# Patient Record
Sex: Male | Born: 1955 | Race: White | Hispanic: No | Marital: Married
Health system: Midwestern US, Academic
[De-identification: ages and names within clinical notes are randomized; demographics above are authoritative.]

---

## 2016-11-15 ENCOUNTER — Encounter: Admit: 2016-11-15 | Discharge: 2016-11-15 | Payer: Commercial Managed Care - PPO

## 2016-11-15 DIAGNOSIS — C61 Malignant neoplasm of prostate: ICD-10-CM

## 2016-11-15 DIAGNOSIS — C9590 Leukemia, unspecified not having achieved remission: ICD-10-CM

## 2016-11-15 DIAGNOSIS — N401 Enlarged prostate with lower urinary tract symptoms: Principal | ICD-10-CM

## 2016-11-16 ENCOUNTER — Ambulatory Visit: Admit: 2016-11-16 | Discharge: 2016-11-16

## 2016-11-16 ENCOUNTER — Encounter: Admit: 2016-11-16 | Discharge: 2016-11-16 | Payer: Commercial Managed Care - PPO

## 2016-11-16 DIAGNOSIS — C61 Malignant neoplasm of prostate: Principal | ICD-10-CM

## 2016-11-16 DIAGNOSIS — R972 Elevated prostate specific antigen [PSA]: ICD-10-CM

## 2016-11-16 DIAGNOSIS — N401 Enlarged prostate with lower urinary tract symptoms: Principal | ICD-10-CM

## 2016-11-16 DIAGNOSIS — N529 Male erectile dysfunction, unspecified: ICD-10-CM

## 2016-11-16 DIAGNOSIS — C9590 Leukemia, unspecified not having achieved remission: ICD-10-CM

## 2016-11-16 NOTE — Progress Notes
Date of Service: 11/16/2016    Subjective:             Paul Stephenson is a 61 y.o. male.    History of Present Illness  Paul Stephenson is a 61 y.o. 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 Miki Kins.    - +LUTS (nocturia x1 and overall pleased) on flomax for years and finasteride since 03.2018  - +hx UTI and epididymitis prompting PSA  - +ED, sexually active but does not use PDE5(-)  - normal DRE  - cysto (05.07.2018) lateral lobe hypertrophy  - 4K (03.09.2018) 23%  - TRUS (03.29.2018) standard 12 core biopsy, L medial apex 1 core Gl 3+3=6 0.73mm; prolaris pending?  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   HENT: Negative for congestion.    Eyes: Negative for visual disturbance.   Respiratory: Negative for chest tightness and shortness of breath.    Cardiovascular: Negative for chest pain, palpitations and leg swelling.   Gastrointestinal: Negative for abdominal pain, constipation and diarrhea.   Endocrine: Negative for polyuria.   Genitourinary: Negative for decreased urine volume, difficulty urinating, discharge, dysuria, flank pain, frequency, genital sores, hematuria, penile pain, penile swelling, scrotal swelling, testicular pain and urgency.   Musculoskeletal: Negative for back pain.   Skin: Negative for rash.   Allergic/Immunologic: Positive for immunocompromised state (CLL).   Neurological: Negative for syncope.   Hematological: Negative for adenopathy. Does not bruise/bleed easily.   Psychiatric/Behavioral: Negative for behavioral problems.         Objective:         ??? finasteride (PROSCAR) 5 mg tablet Take 5 mg by mouth daily.   ??? NAPROXEN PO Take  by mouth. ??? oxymetazoline (AFRIN) 0.05 % nasal spray Apply 2 sprays to each nostril as directed twice daily.   ??? tamsulosin (FLOMAX) 0.4 mg capsule Take 0.4 mg by mouth daily. Do not crush, chew or open capsules. Take 30 minutes following the same meal each day.     Vitals:    11/16/16 1016   Weight: 101.6 kg (224 lb)   Height: 185.4 cm (73)     Body mass index is 29.55 kg/m???.     Physical Exam   Constitutional: He appears well-developed and well-nourished. No distress.   HENT:   Head: Normocephalic and atraumatic.   Mouth/Throat: No oropharyngeal exudate.   Eyes: Pupils are equal, round, and reactive to light. No scleral icterus.   Neck: Normal range of motion.   Cardiovascular: Normal rate and regular rhythm.    Pulmonary/Chest: Effort normal. No respiratory distress.   Abdominal: Soft. Bowel sounds are normal. He exhibits no distension and no mass. There is no tenderness. There is no rebound and no guarding. No hernia.   Right groin incision healed   Genitourinary: Rectum normal, prostate normal and penis normal.   Genitourinary Comments: 20g, no nodules, symmetric  Small right epididymal cyst, nontender; otherwise normal scrotal contents  Circumcised, orthotopic meatus without discharge   Skin: He is not diaphoretic.            Assessment and Plan:  Paul Stephenson is a 61 y.o.  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 Discussed all treatment options and risks/benefits associated with each including active surveillance, ablation, radiation therapy, and radical prostatectomy.    Discussed our recommendation for mpMRI of prostate and confirmatory fusion biopsy. If findings are consistent with initial biopsy, would initiate active surveillance. Patient would like to discuss with his wife and will call with his ultimate decision. Reading materials were given. We did also offer referral to radiation oncology.     Farrel Conners, MD  Clinical Urologic Oncology Fellow                    Paul Stephenson is a 54 y.o. gentleman with prostate cancer.  He presents to clinic today for further counseling and treatment options. I went over AS, XRT, cryotherapy, HIFU and RALP.  All of the details were covered with each.       I spent over , extensively counseling him face-to-face regarding the risks & benefits of robotic prostatectomy and the other options.  I explained that the potential benefits of robotic surgery include less blood loss & shorter hospital stay.  Positive margins, incontinence, & erectile dysfunction rates are similar compared to radical retropubic prostatectomy.  Possible risks and complications of robotic prostatectomy include, but are not limited to bleeding, infection, allergic reaction, heart & blood pressure problems, incontinence, erectile dysfunction, bladder neck contracture, urine leak, lymphocele, anastomotic breakdown, need for percutaneous nephrostomy tube and/ or abdominal drain placement, wound complications, nerve injury due to positioning, injury to nearby contiguous organs or structures, hernias, robot malfunction, conversion to open surgery or inability to complete the surgery.  Additional surgical risks include, but not limited to DVT, heart attack, stroke, pulmonary embolism, respiratory arrest, & death.  He is fully aware of the potential risks & complications.    He would like to proceed with a discussion with his wife and decide if he would like to do an MRI to start with..    I recommended that if  he proceeds with robotic laparoscopic assisted prostatectomy with non nerve sparing.  He does not need a bilateral pelvic lymph node dissection.     All of his questions were answered to his satisfaction.      Eda Paschal, MD

## 2016-11-19 ENCOUNTER — Encounter: Admit: 2016-11-19 | Discharge: 2016-11-19 | Payer: Commercial Managed Care - PPO

## 2016-11-19 DIAGNOSIS — C61 Malignant neoplasm of prostate: Principal | ICD-10-CM

## 2016-11-19 NOTE — Progress Notes
Orders Placed This Encounter    MRI PELVIS WO/W CONTRAST       Ihor Gully, PA-C  Urology    ===========================================================================================    MRI   Received: 11/19/2016  Message Contents   Beaulah Corin, PA-C            After discussing with his wife, Mr. Hermiz would like to proceed with scheduling an MRI. Can you please place an order?     Thanks,     [place]

## 2016-12-10 ENCOUNTER — Ambulatory Visit: Admit: 2016-12-10 | Discharge: 2016-12-10

## 2016-12-10 DIAGNOSIS — C61 Malignant neoplasm of prostate: Principal | ICD-10-CM

## 2016-12-10 LAB — POC CREATININE, RAD: Lab: 0.6 mg/dL (ref 0.4–1.24)

## 2016-12-10 MED ORDER — GADOBENATE DIMEGLUMINE 529 MG/ML (0.1MMOL/0.2ML) IV SOLN
20 mL | Freq: Once | INTRAVENOUS | 0 refills | Status: CP
Start: 2016-12-10 — End: ?
  Administered 2016-12-10: 20:00:00 20 mL via INTRAVENOUS

## 2016-12-10 MED ORDER — SODIUM CHLORIDE 0.9 % IJ SOLN
50 mL | Freq: Once | INTRAVENOUS | 0 refills | Status: CP
Start: 2016-12-10 — End: ?
  Administered 2016-12-10: 20:00:00 50 mL via INTRAVENOUS

## 2016-12-13 ENCOUNTER — Encounter: Admit: 2016-12-13 | Discharge: 2016-12-13 | Payer: Commercial Managed Care - PPO

## 2016-12-13 DIAGNOSIS — N401 Enlarged prostate with lower urinary tract symptoms: ICD-10-CM

## 2016-12-13 DIAGNOSIS — C61 Malignant neoplasm of prostate: Principal | ICD-10-CM

## 2016-12-13 NOTE — Telephone Encounter
Called pt w/ MRI Prostate results.  I read the results verbatim & then explained them in non-medical terms.      MRI Prostate (12/10/2016): No focal peripheral zone lesion to suggest high-grade prostate cancer.  Prostate volume = 85 mL.    RTC as scheduled for TRUS/ Prostate bx on 02/01/2017, as scheduled, for prostate cancer active surveillance protocol.  Pt demonstrated an understanding & agrees w/ plan.      Ihor Gully, PA-C  Urology

## 2017-01-26 ENCOUNTER — Encounter: Admit: 2017-01-26 | Discharge: 2017-01-26 | Payer: Commercial Managed Care - PPO

## 2017-01-26 MED ORDER — CIPROFLOXACIN HCL 500 MG PO TAB
500 mg | ORAL_TABLET | Freq: Two times a day (BID) | ORAL | 0 refills | 10.00000 days | Status: AC
Start: 2017-01-26 — End: ?

## 2017-02-01 ENCOUNTER — Encounter: Admit: 2017-02-01 | Discharge: 2017-02-01

## 2017-02-01 ENCOUNTER — Ambulatory Visit: Admit: 2017-02-01 | Discharge: 2017-02-01

## 2017-02-01 ENCOUNTER — Encounter: Admit: 2017-02-01 | Discharge: 2017-02-01 | Payer: Commercial Managed Care - PPO

## 2017-02-01 DIAGNOSIS — N401 Enlarged prostate with lower urinary tract symptoms: Principal | ICD-10-CM

## 2017-02-01 DIAGNOSIS — C61 Malignant neoplasm of prostate: Principal | ICD-10-CM

## 2017-02-01 DIAGNOSIS — R972 Elevated prostate specific antigen [PSA]: ICD-10-CM

## 2017-02-01 DIAGNOSIS — N529 Male erectile dysfunction, unspecified: ICD-10-CM

## 2017-02-01 DIAGNOSIS — C9590 Leukemia, unspecified not having achieved remission: ICD-10-CM

## 2017-02-01 MED ORDER — GENTAMICIN 40 MG/ML IJ SOLN
80 mg | Freq: Once | INTRAMUSCULAR | 0 refills | Status: CP
Start: 2017-02-01 — End: ?

## 2017-02-01 NOTE — Procedures
Procedure:  Transrectal ultrasound, ultrasound interpretation, periprostatic block, and multiple prostate biopsies.    Provider:  Cleatis Polka, MD    Preoperative Diagnosis:  CaP on AS with nothing on MRI    Postoperative Diagnosis:  Same    Anesthesia:  1% Lidocaine Block, 8 mL total    Complications:  None.    Indications for Procedure:  61 y.o. male with CaP on AS.  He discontinued all blood thinners & anticoagulants.  He signed informed consent prior to procedure.    Description of Operative Procedure:  After this man was placed in the left knee-chest position, prepped and draped in usual sterile fashion, a 7.5 MHz transrectal ultrasound probe was advanced into the rectum.  The prostate was examined from its apex to its base and from the lateral lobe to lateral lobe.  No hypoechoic lesions.  Periurethral calcifications.  1% lidocaine block was given at the start of the procedure at the junction of the seminal vesicle and base of the prostate.  A total of 8 cc were used.  He had excellent anesthesia from this block.  18-gauge Biopty gun was then used to biopsy the prostate starting in the paramedian plane at the base, mid and apex bilaterally and then in the anterior horn plane at the base, mid and apex bilaterally.  He tolerated the procedure well.      At the end of the procedure, we went over potential complications and side effects.  Post-procedure restrictions reviewed.  I counseled him that it is common to see blood in urine & bowel movements x 3 days & blood in the ejaculate fluid x 6 wks.  I counseled him that if he develops increased bleeding or other concerns to contact my office immediately.  He will take one more day of antibiotics and call us in 7-10 days for biopsy results.  He was discharged in excellent condition.      Cleatis Polka, MD

## 2017-02-03 ENCOUNTER — Encounter: Admit: 2017-02-03 | Discharge: 2017-02-03 | Payer: Commercial Managed Care - PPO

## 2017-02-03 NOTE — Telephone Encounter
02/03/2017 at 03:58 pm,     Pt's wife Paul Stephenson L/m on V/m, stating her husband had a Prstate Bx done on Tuesday and he is now having blood in his urine.     02/03/2017 at 04:30 pm,     (C) Called pt's wife Paul Stephenson back, she stated the above again. Asked her if he is urinating excessive blood or is it a light tent pinkish color? She stated he is only urinating a light pinkish color. Asked her if the pt is having pain while urinating or having problems urinating at all? She stated pt can urinate without a problem with no pain. Asked her if the pt is having regular BM's? She stated he is indeed. Asked her if pt has any fevers, chills, nausea, or vomiting. She stated denies any of those sx's. Asked her when the pt started having the light blood in the urine? She stated today. Explained he should be okay since he does have a light pink tent in the urine. Asked her to please contact ASAP if the sx's were to worsen. Pt's wife voiced understanding.

## 2017-02-03 NOTE — Telephone Encounter
Spoke with patient about the results of his biopsy and informed him that the results came back benign. He is scheduled to follow up in April with Clair Gulling. A copy of his results have been mailed to him. He said that he would call back if he has any other questions.

## 2017-02-07 ENCOUNTER — Encounter: Admit: 2017-02-07 | Discharge: 2017-02-07 | Payer: Commercial Managed Care - PPO

## 2017-02-07 DIAGNOSIS — C61 Malignant neoplasm of prostate: Principal | ICD-10-CM

## 2017-07-29 LAB — PROSTATIC SPECIFIC ANTIGEN-PSA: Lab: 4.8 ng/mL — ABNORMAL HIGH (ref 0.0–4.0)

## 2017-08-05 ENCOUNTER — Ambulatory Visit: Admit: 2017-08-05 | Discharge: 2017-08-06 | Payer: Commercial Managed Care - PPO

## 2017-08-05 ENCOUNTER — Encounter: Admit: 2017-08-05 | Discharge: 2017-08-05 | Payer: Commercial Managed Care - PPO

## 2017-08-05 DIAGNOSIS — N401 Enlarged prostate with lower urinary tract symptoms: Principal | ICD-10-CM

## 2017-08-05 DIAGNOSIS — C9590 Leukemia, unspecified not having achieved remission: ICD-10-CM

## 2017-08-05 DIAGNOSIS — C61 Malignant neoplasm of prostate: Principal | ICD-10-CM

## 2017-08-05 DIAGNOSIS — N529 Male erectile dysfunction, unspecified: ICD-10-CM

## 2017-08-05 DIAGNOSIS — R972 Elevated prostate specific antigen [PSA]: ICD-10-CM

## 2017-08-13 ENCOUNTER — Encounter: Admit: 2017-08-13 | Discharge: 2017-08-13 | Payer: Commercial Managed Care - PPO

## 2017-08-13 DIAGNOSIS — N401 Enlarged prostate with lower urinary tract symptoms: Principal | ICD-10-CM

## 2017-08-13 DIAGNOSIS — C9590 Leukemia, unspecified not having achieved remission: ICD-10-CM

## 2017-08-13 DIAGNOSIS — C61 Malignant neoplasm of prostate: ICD-10-CM

## 2017-08-13 DIAGNOSIS — R972 Elevated prostate specific antigen [PSA]: ICD-10-CM

## 2017-08-13 DIAGNOSIS — N529 Male erectile dysfunction, unspecified: Secondary | ICD-10-CM

## 2023-02-01 IMAGING — CR [ID]
6 series · 6 of 6 positions shown · non-contrast
Comparison: none

[w cervical spine lat]
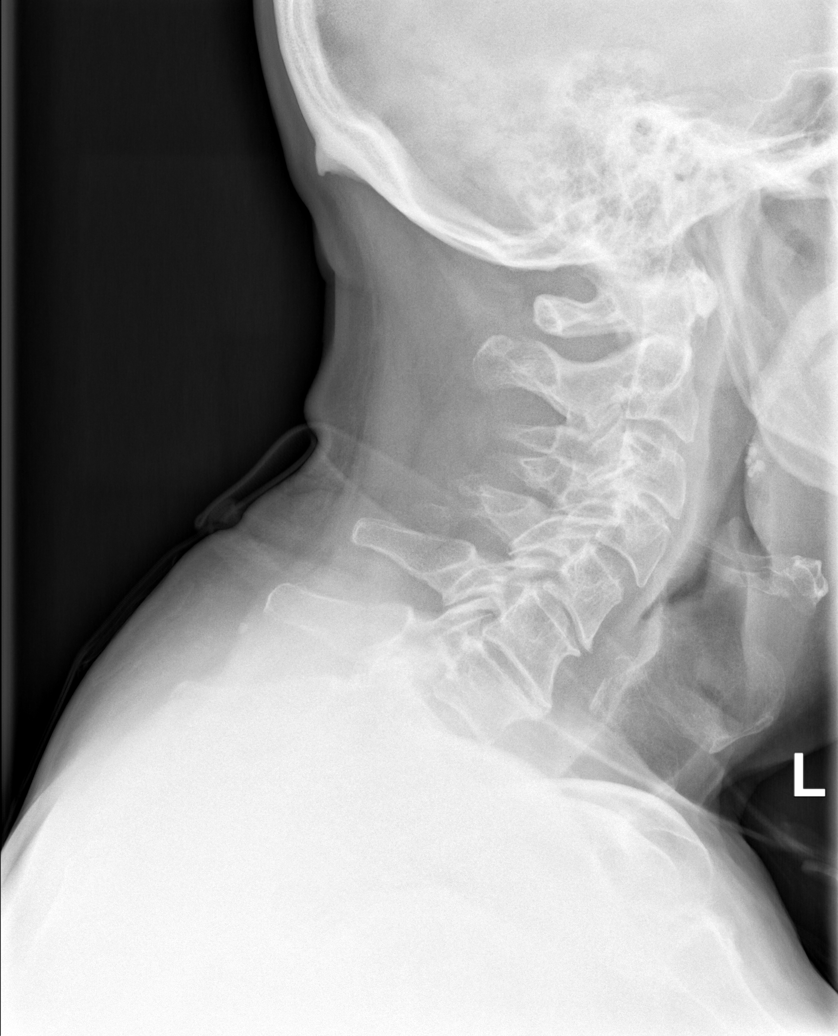

[w cervical spine ap_obl (1 of 2)]
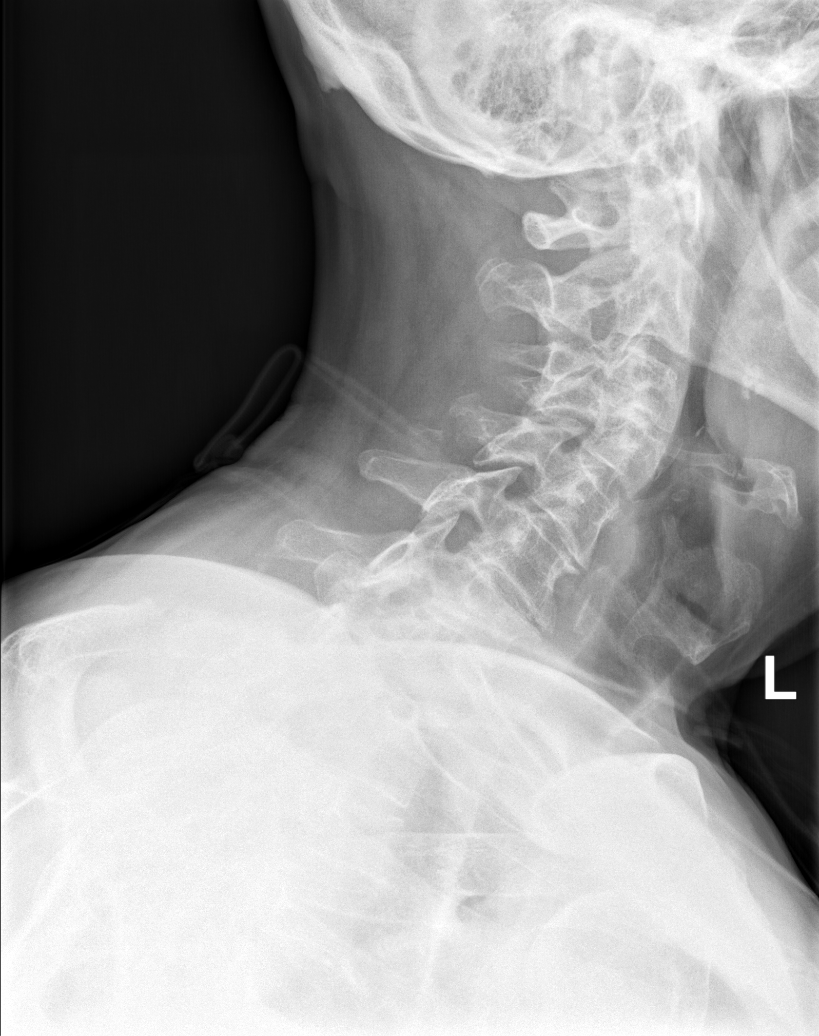

[w cervical spine ap_obl (2 of 2)]
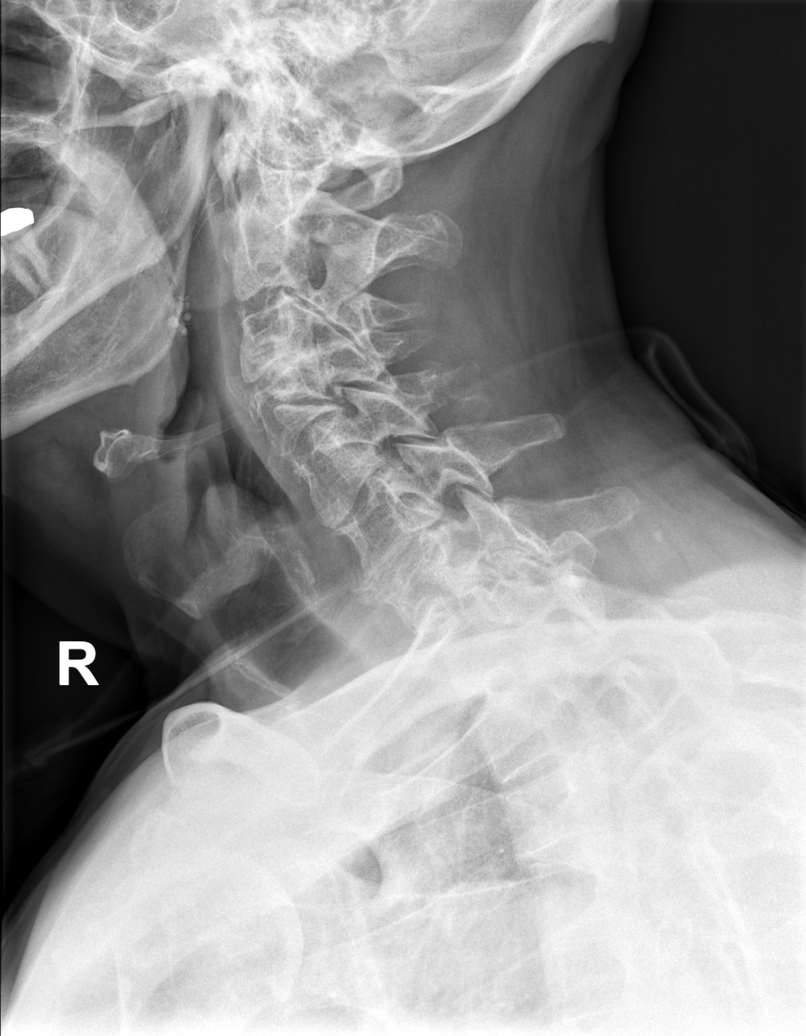

[w cervical spine ap]
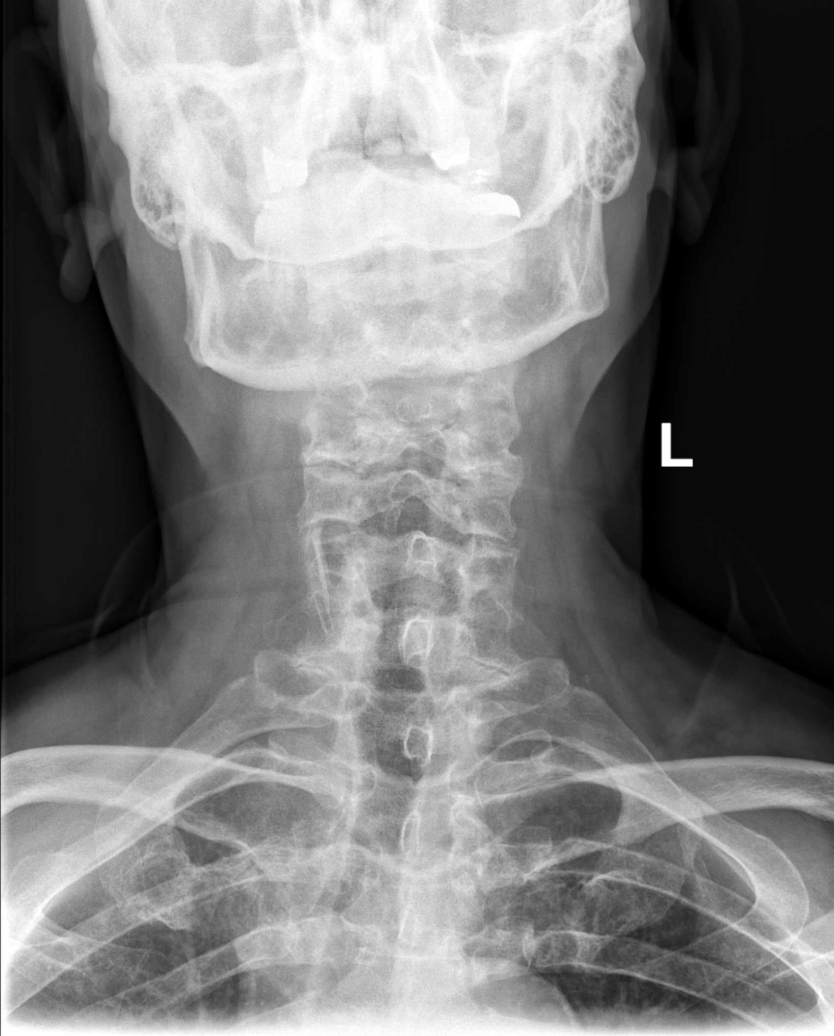

[w cervical spine odontoid (1 of 2)]
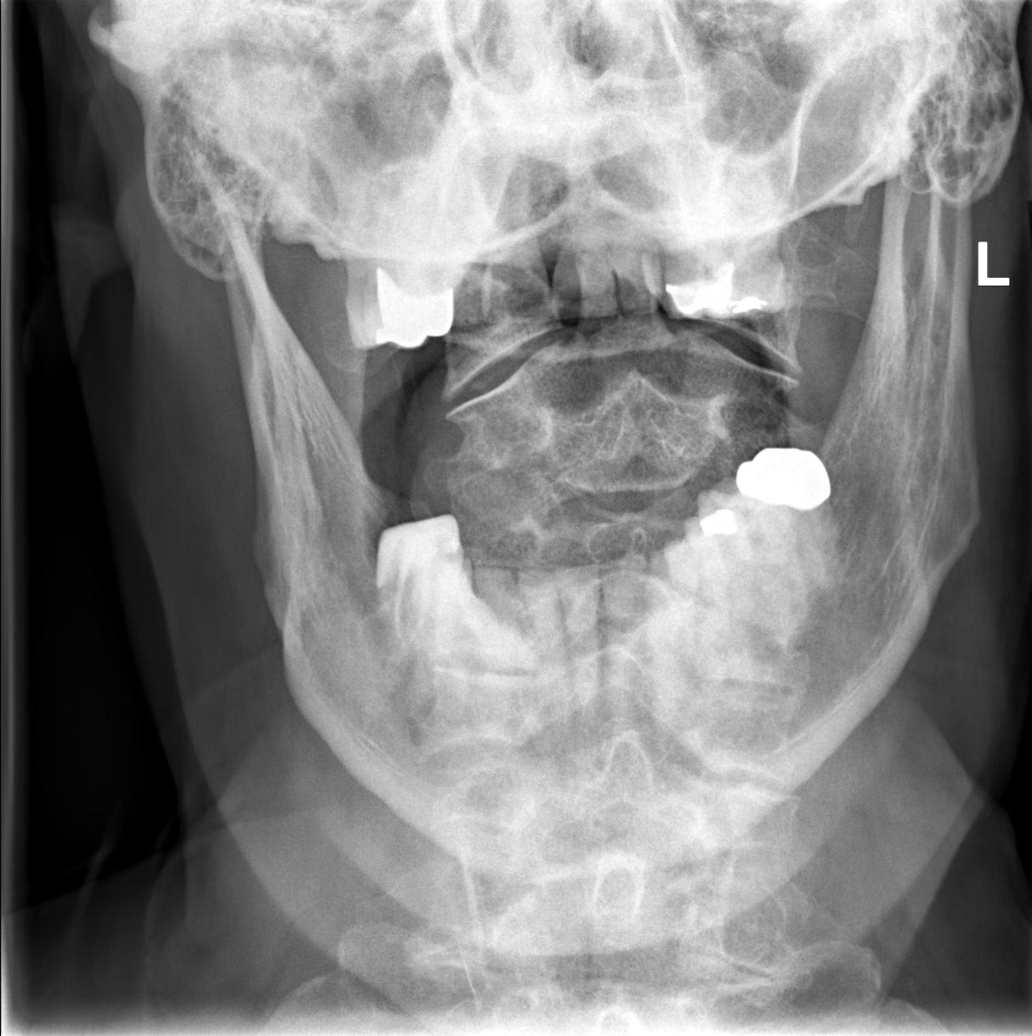

[w cervical spine odontoid (2 of 2)]
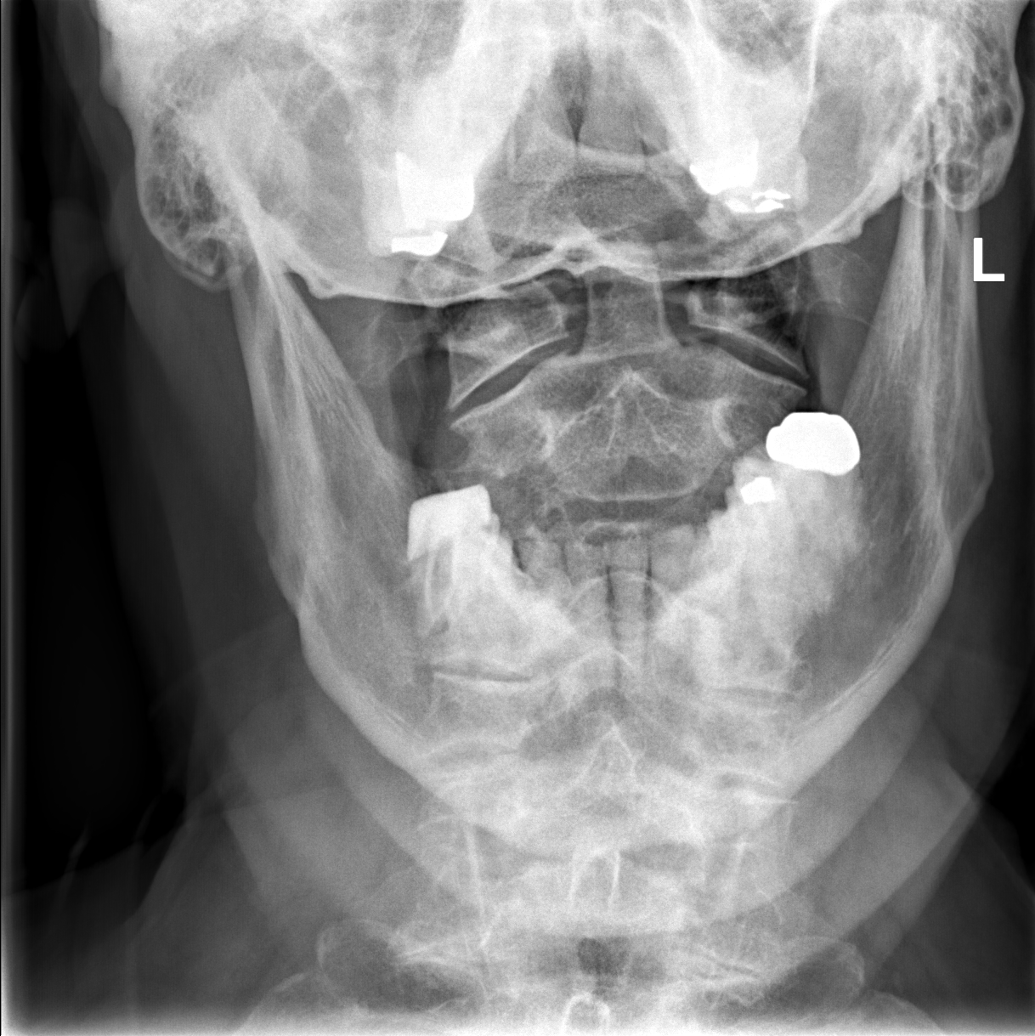

[6 of 6 positions shown; findings below may reference images not displayed]

DIAGNOSTIC STUDIES

EXAM

XR cervical spine 5V

INDICATION

Pt fell on neck 6 days ago, landing head first
neck pain that goes down left arm after a fall last [REDACTED] where pt fell and hit head on garage
door, denies surgery or prior trauma

TECHNIQUE

AP lateral both oblique and odontoid views

COMPARISONS

None available

FINDINGS

Apparent fracture through the spinous process of seen 5 is noted. There is disc space narrowing
osteophytic spurring at C5-6 and C6-7. Prevertebral soft tissues are within normal limits. There is
normal alignment the cervical vertebral bodies. CT cervical spine is recommended.

IMPRESSION

Apparent fracture through the spinous process of C5. Some of the configuration likely is
developmental however does appear to be displaced and now when compared to prior CT soft tissues of
the neck. CT scan of the neck is recommended. Report called to Daija Monge at [DATE] a.m..

Tech Notes:

neck pain that goes down left arm after a fall last [REDACTED] where pt fell and hit head on garage
door, denies surgery or prior trauma

## 2023-02-01 IMAGING — CT C_SPINE_(Adult)
4 of 5 series · 14 of 33 positions shown, 16 images · non-contrast
Comparison: none

[Series 2: c-spine ax 2.00 br60 s3 · axial · 0.31mm/px · z∈[-635,-568]mm · 2 of 105 slices shown]
[im 35/105  bone]
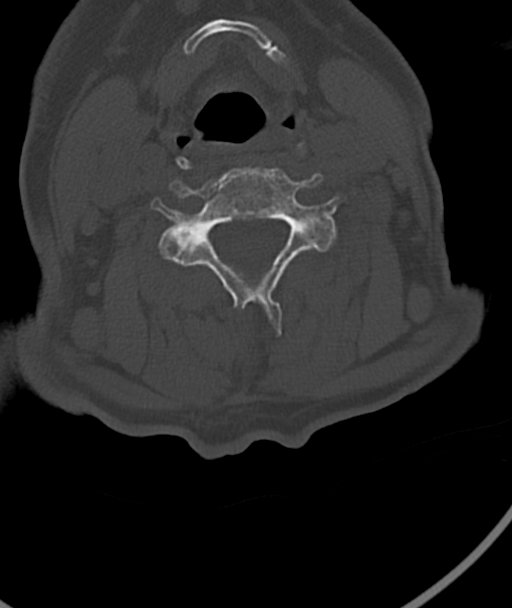
[im 70/105  bone]
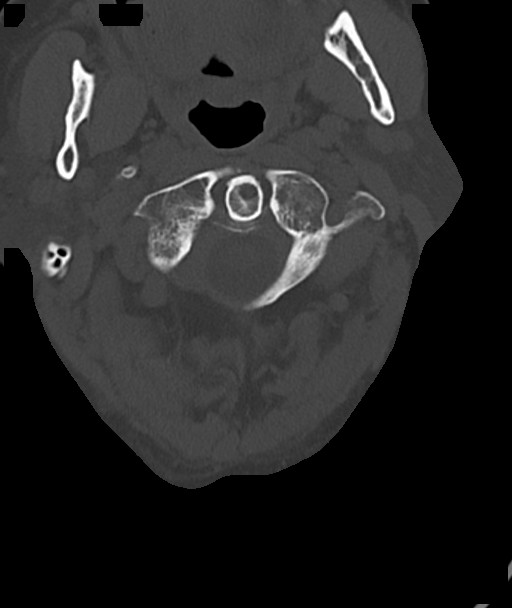

[Series 4: c-spine cor 2.00 br60 s3 · coronal · 0.31mm/px · 3 of 93 slices shown]
[im 19/93  bone]
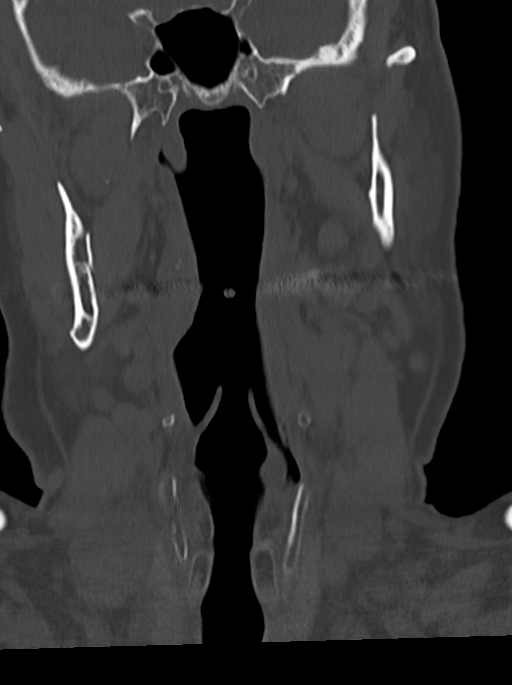
[im 37/93  bone]
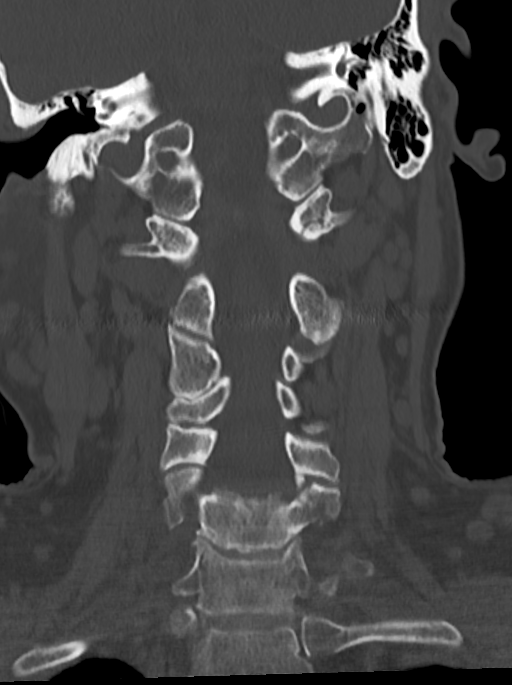
[im 56/93  bone]
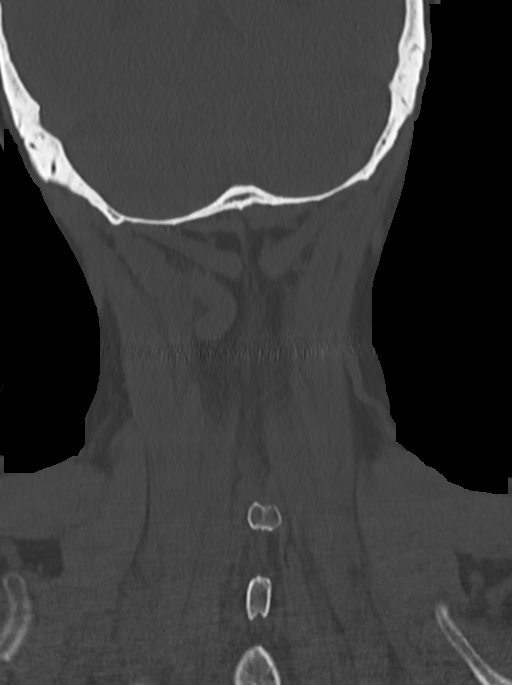

[Series 6: c-spine sag 2.00 br60 s3 · sagittal · 0.37mm/px · 5 of 78 slices shown, 6 images]
[im 26/78  bone]
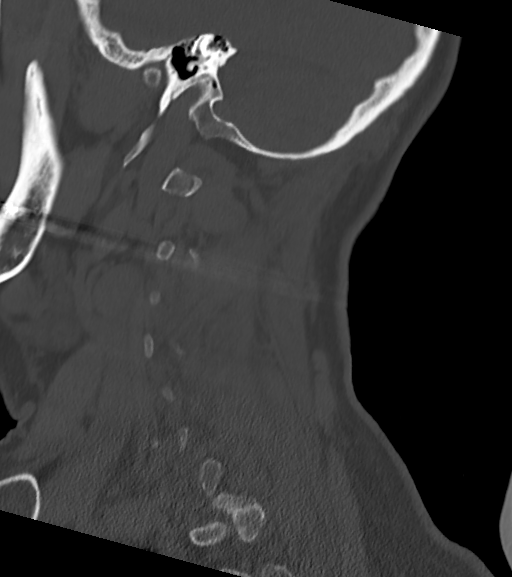
[im 33/78  bone]
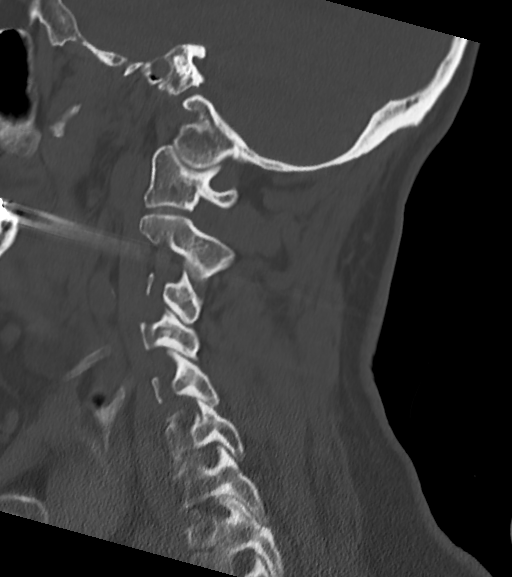
[im 39/78  soft-tissue]
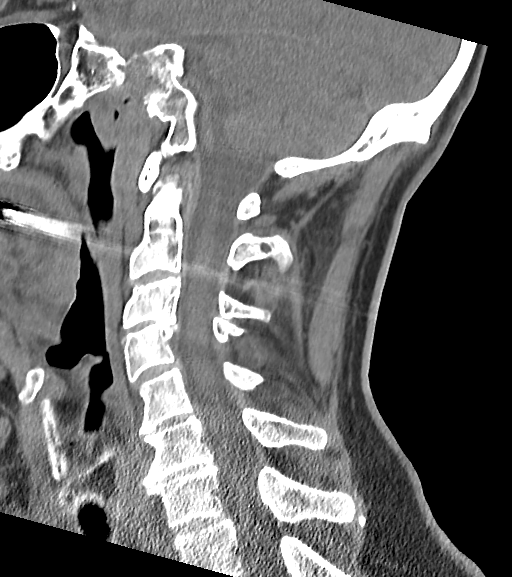
[im 39/78  bone]
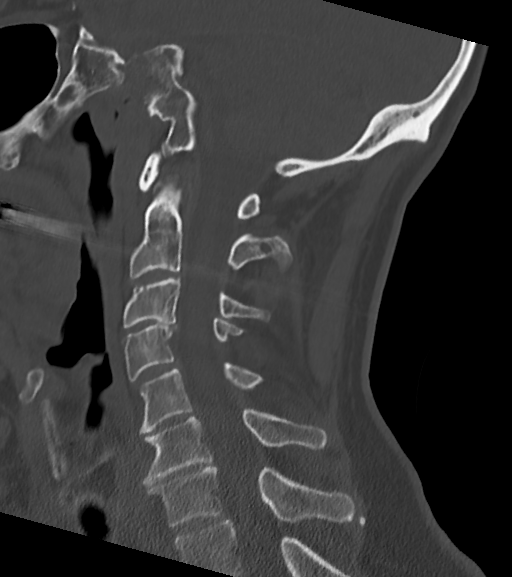
[im 45/78  bone]
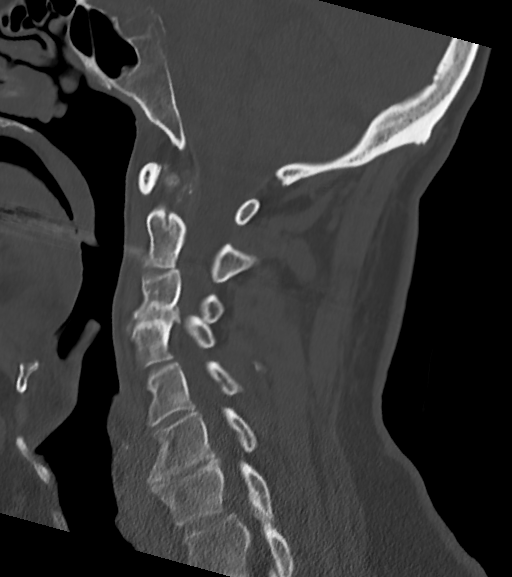
[im 52/78  bone]
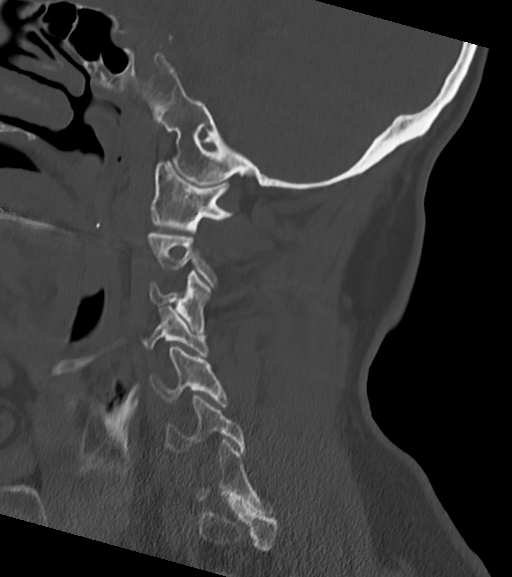

[Series 11: c-spine ax 1.00 br40 s3 · oblique · 0.31mm/px · 4 of 155 slices shown, 5 images]
[im 31/155  soft-tissue]
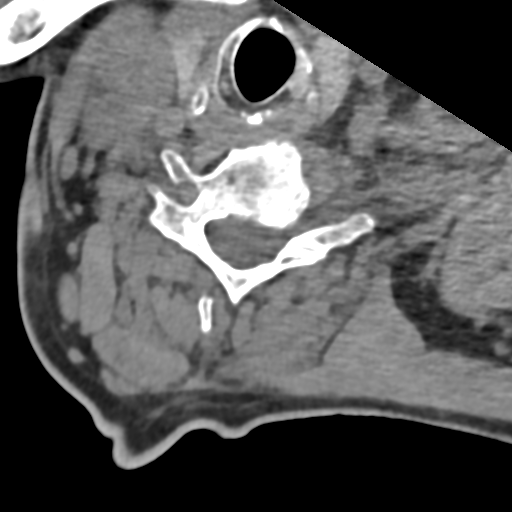
[im 31/155  bone]
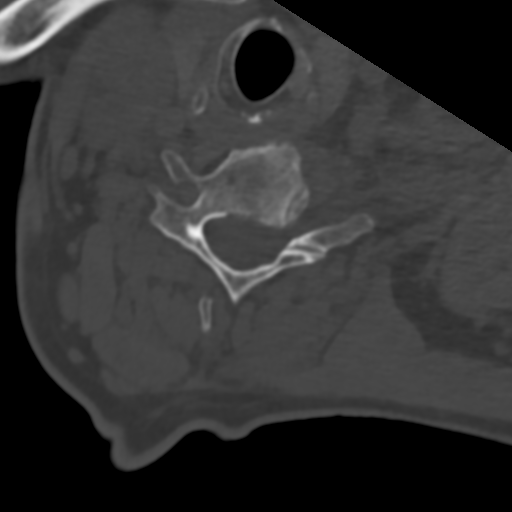
[im 62/155  bone]
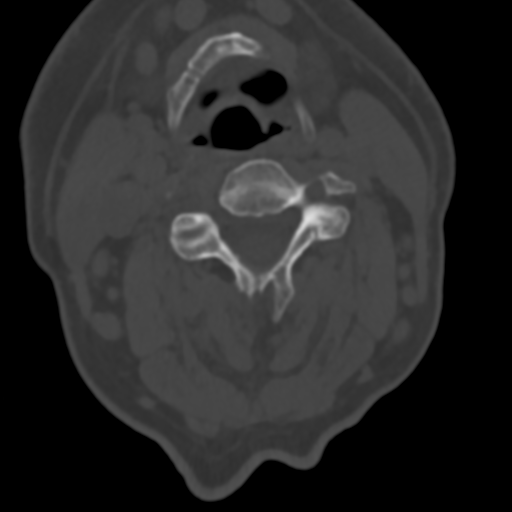
[im 93/155  bone]
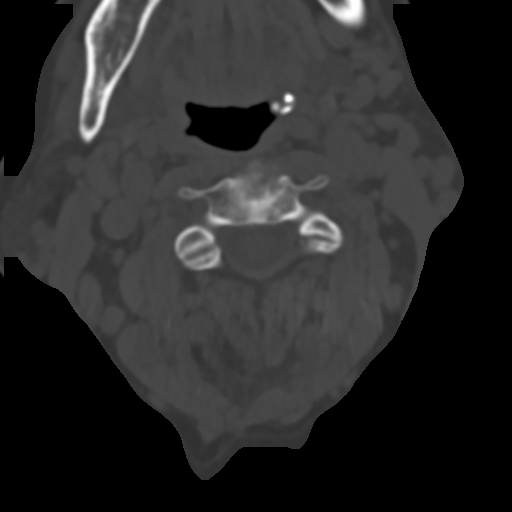
[im 124/155  bone]
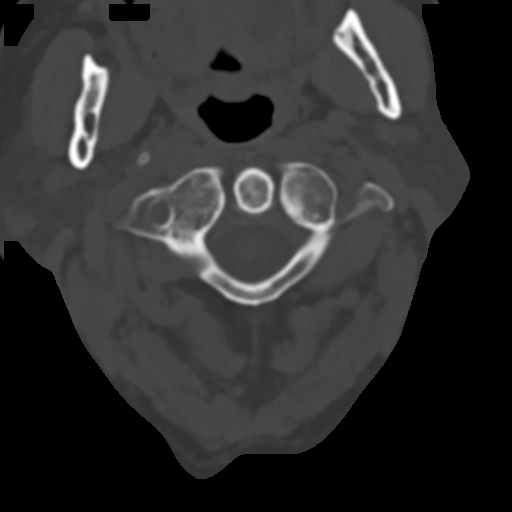

[14 of 33 positions shown; findings below may reference images not displayed]

DIAGNOSTIC STUDIES

EXAM

CT scan of the cervical spine without contrast.

INDICATION

Displaced C5 facture on xray
TF3SSR0WE C5 SPINOUS PROCESS FX, NECK PAIN THAT GOES DOWN INTO LEFT ARM AFTER A FALL LAST [REDACTED],
DENIES OTHER TRAUMA TO NECK OR SURGERY HX. CT/NM 0/0

TECHNIQUE

All CT scans at this facility use dose modulation, iterative reconstruction, and/or weight based
dosing when appropriate to reduce radiation dose to as low as reasonably achievable.

Number of previous computed tomography exams in the last 12 months is 0  .

Number of previous nuclear medicine myocardial perfusion studies in the last 12 months is 0  .

COMPARISONS

Soft tissue neck CT September 20, 2017

FINDINGS

There are bifid spinous processes at C2 through C5. The right components at C4 and C5 are fractured
image 62 series 6 and image 71 series 2.

In addition, there is a nondisplaced fracture through the left facet at C6 image 30 series 6. No
other fractures are seen. Reactive cervical lymph nodes are evident.

IMPRESSION

Fractures of the right spinous components at C4-C5.

Nondisplaced fracture through the left facet at C6. Report called to Gllazira Pahler at [DATE] p.m..

Tech Notes:

TF3SSR0WE C5 SPINOUS PROCESS FX, NECK PAIN THAT GOES DOWN INTO LEFT ARM AFTER A FALL LAST [REDACTED],
DENIES OTHER TRAUMA TO NECK OR SURGERY HX. CT/NM 0/0
